# Patient Record
Sex: Male | Born: 1955 | Race: White | Marital: Married | Smoking: Former smoker
Health system: Southern US, Community
[De-identification: ages and names within clinical notes are randomized; demographics above are authoritative.]

---

## 2014-09-21 ENCOUNTER — Other Ambulatory Visit: Payer: Self-pay | Admitting: Neurosurgery

## 2014-09-21 DIAGNOSIS — M5126 Other intervertebral disc displacement, lumbar region: Secondary | ICD-10-CM

## 2014-09-22 ENCOUNTER — Ambulatory Visit
Admission: RE | Admit: 2014-09-22 | Discharge: 2014-09-22 | Disposition: A | Discharge status: Home / Self Care | Payer: Managed Care, Other (non HMO) | Source: Ambulatory Visit | Attending: Neurosurgery | Admitting: Neurosurgery

## 2014-09-22 DIAGNOSIS — M5126 Other intervertebral disc displacement, lumbar region: Secondary | ICD-10-CM

## 2014-09-22 MED ORDER — IOHEXOL 180 MG/ML  SOLN
1.0000 mL | Freq: Once | INTRAMUSCULAR | Status: AC | PRN
  Administered 2014-09-22: 1 mL via EPIDURAL

## 2014-09-22 MED ORDER — METHYLPREDNISOLONE ACETATE 40 MG/ML INJ SUSP (RADIOLOG
120.0000 mg | Freq: Once | INTRAMUSCULAR | Status: AC
  Administered 2014-09-22: 120 mg via EPIDURAL

## 2014-09-22 NOTE — Discharge Instructions (Signed)

## 2014-10-27 ENCOUNTER — Other Ambulatory Visit: Payer: Self-pay | Admitting: Neurosurgery

## 2014-10-27 DIAGNOSIS — M5126 Other intervertebral disc displacement, lumbar region: Secondary | ICD-10-CM

## 2014-10-28 ENCOUNTER — Ambulatory Visit
Admission: RE | Admit: 2014-10-28 | Discharge: 2014-10-28 | Disposition: A | Discharge status: Home / Self Care | Payer: Managed Care, Other (non HMO) | Source: Ambulatory Visit | Attending: Neurosurgery | Admitting: Neurosurgery

## 2014-10-28 ENCOUNTER — Other Ambulatory Visit: Payer: Self-pay | Admitting: Neurosurgery

## 2014-10-28 DIAGNOSIS — M5126 Other intervertebral disc displacement, lumbar region: Secondary | ICD-10-CM

## 2014-10-28 MED ORDER — IOHEXOL 180 MG/ML  SOLN
1.0000 mL | Freq: Once | INTRAMUSCULAR | Status: AC | PRN
  Administered 2014-10-28: 1 mL via INTRAVENOUS

## 2014-10-28 MED ORDER — METHYLPREDNISOLONE ACETATE 40 MG/ML INJ SUSP (RADIOLOG
120.0000 mg | Freq: Once | INTRAMUSCULAR | Status: AC
  Administered 2014-10-28: 120 mg via EPIDURAL

## 2014-12-09 ENCOUNTER — Other Ambulatory Visit: Payer: Self-pay | Admitting: Neurosurgery

## 2014-12-09 DIAGNOSIS — M5126 Other intervertebral disc displacement, lumbar region: Secondary | ICD-10-CM

## 2014-12-12 ENCOUNTER — Ambulatory Visit
Admission: RE | Admit: 2014-12-12 | Discharge: 2014-12-12 | Disposition: A | Discharge status: Home / Self Care | Payer: Managed Care, Other (non HMO) | Source: Ambulatory Visit | Attending: Neurosurgery | Admitting: Neurosurgery

## 2014-12-12 DIAGNOSIS — M5126 Other intervertebral disc displacement, lumbar region: Secondary | ICD-10-CM

## 2014-12-12 IMAGING — XA DG EPIDUROGRAM S+I
1 series · 3 of 3 positions shown · non-contrast
Comparison: none

CLINICAL DATA: Lumbosacral spondylosis without myelopathy.
Displacement of the L4-5 lumbar disc. Right lower extremity
radiculitis. Patient had excellent relief of pain for approximately
3 weeks. The pain has since recurred.

[Series 1: ortho standard · 3 of 3 slices shown]
[im 1/3]
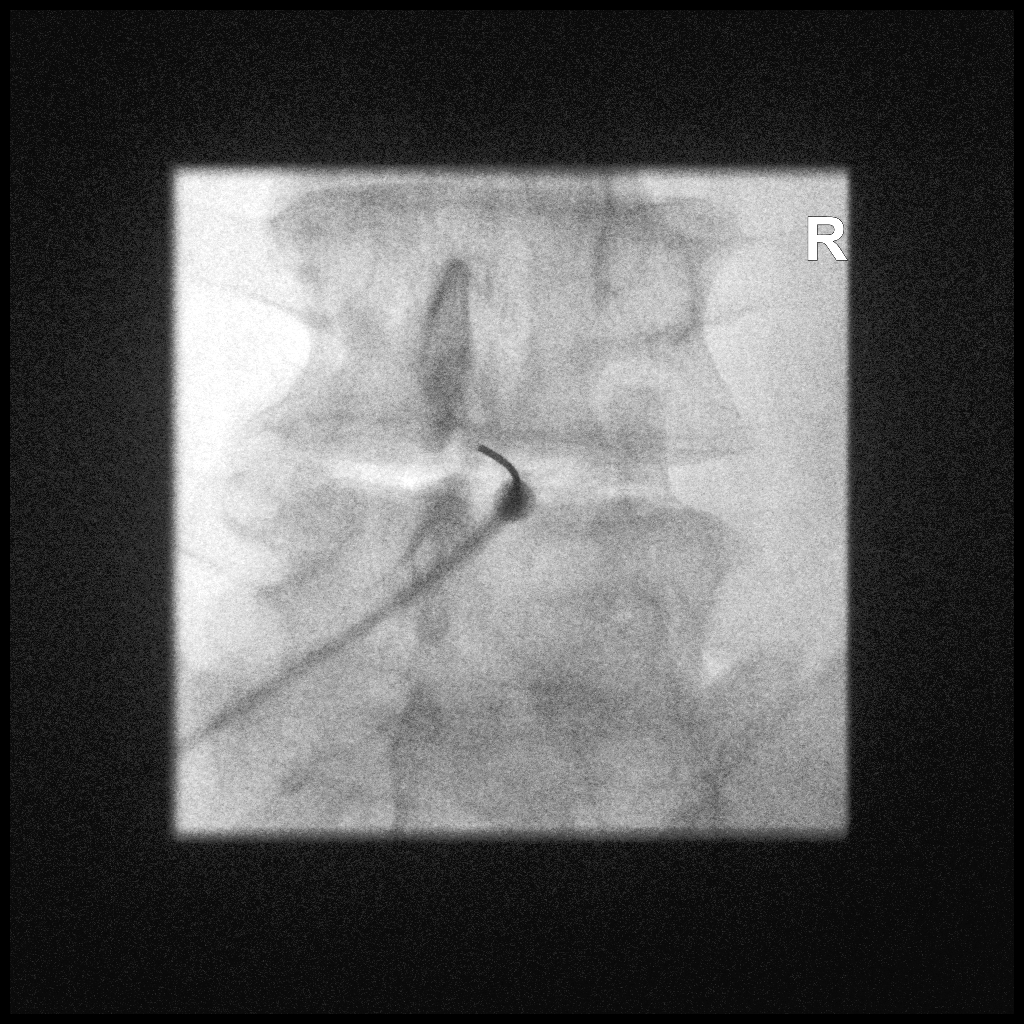
[im 2/3]
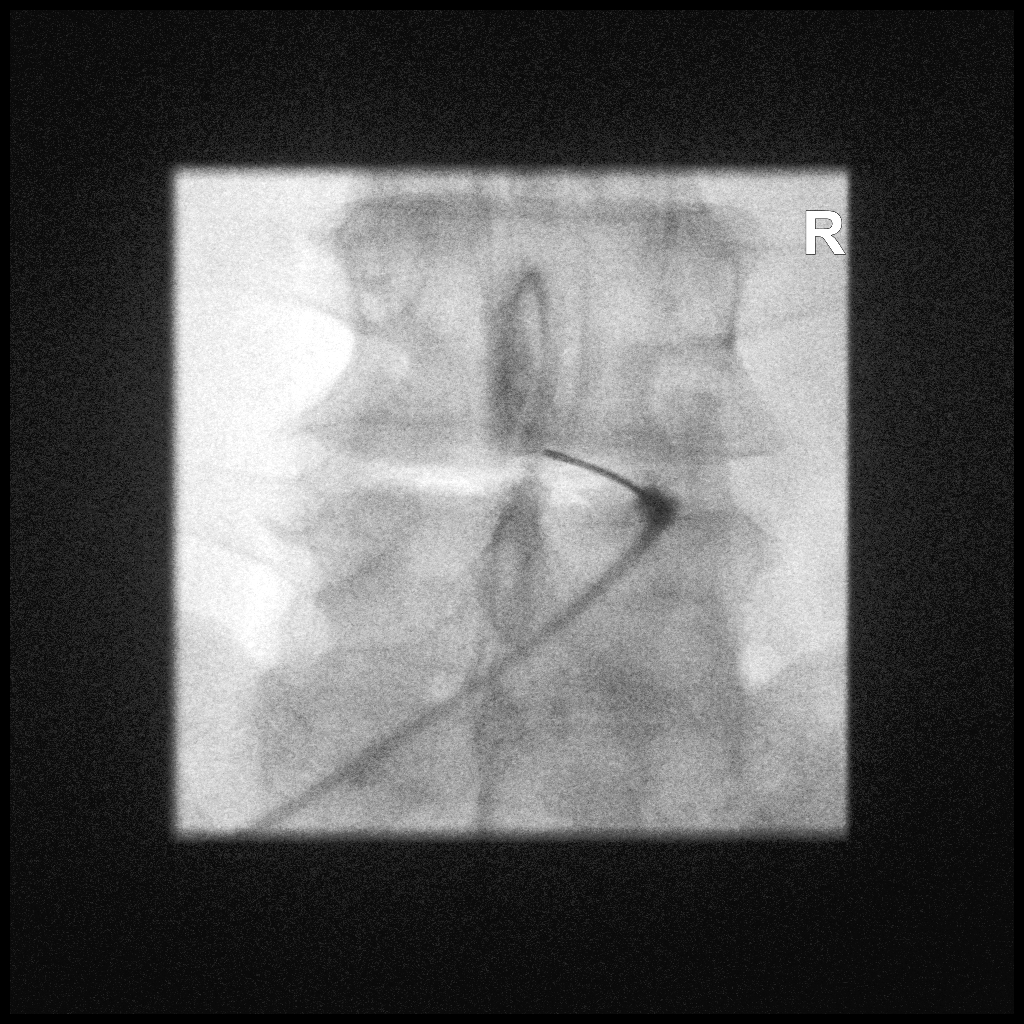
[im 3/3]
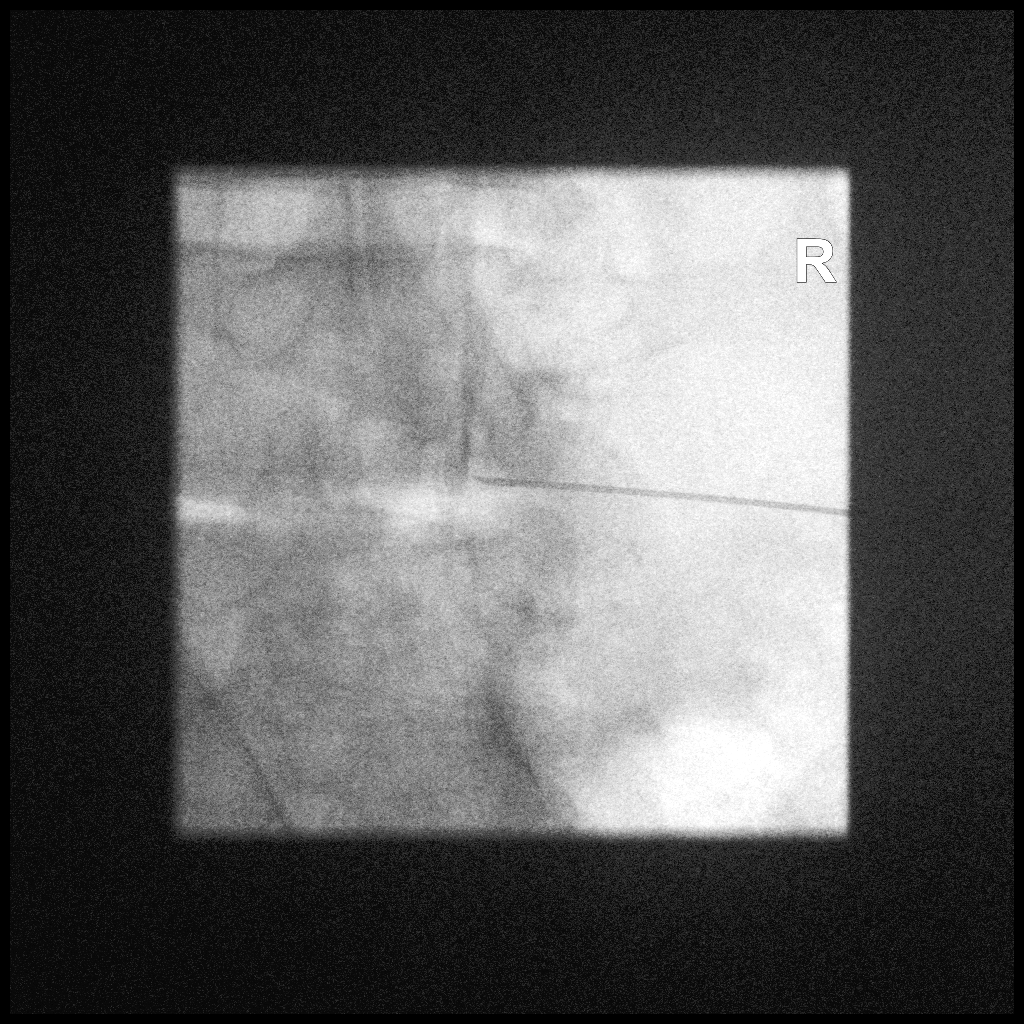

[3 of 3 positions shown; findings below may reference images not displayed]

FLUOROSCOPY TIME:  112.56 uGy*m2

PROCEDURE:
The procedure, risks, benefits, and alternatives were explained to
the patient. Questions regarding the procedure were encouraged and
answered. The patient understands and consents to the procedure.

LUMBAR EPIDURAL INJECTION:

An interlaminar approach was performed on right at L4-5. The
overlying skin was cleansed and anesthetized. A 20 gauge Crawford
epidural needle was advanced using loss-of-resistance technique.

DIAGNOSTIC EPIDURAL INJECTION:

Injection of Omnipaque 180 shows a good epidural pattern with spread
above and below the level of needle placement, primarily on the
right no vascular opacification is seen.

THERAPEUTIC EPIDURAL INJECTION:

120 Mg of Depo-Medrol mixed with 3 mL 1% lidocaine were instilled.
The procedure was well-tolerated, and the patient was discharged
thirty minutes following the injection in good condition.

COMPLICATIONS:
None.
IMPRESSION: Technically successful epidural injection on the right L4-5 # 3

## 2014-12-12 MED ORDER — METHYLPREDNISOLONE ACETATE 40 MG/ML INJ SUSP (RADIOLOG
120.0000 mg | Freq: Once | INTRAMUSCULAR | Status: AC
  Administered 2014-12-12: 120 mg via EPIDURAL

## 2014-12-12 MED ORDER — IOHEXOL 180 MG/ML  SOLN
1.0000 mL | Freq: Once | INTRAMUSCULAR | Status: AC | PRN
  Administered 2014-12-12: 1 mL via EPIDURAL
